# Patient Record
Sex: Female | Born: 1970 | Race: White | Hispanic: No | Marital: Married
Health system: Southern US, Community
[De-identification: ages and names within clinical notes are randomized; demographics above are authoritative.]

---

## 2015-08-01 ENCOUNTER — Other Ambulatory Visit: Payer: Self-pay

## 2015-08-01 DIAGNOSIS — Z1231 Encounter for screening mammogram for malignant neoplasm of breast: Secondary | ICD-10-CM

## 2015-09-05 ENCOUNTER — Other Ambulatory Visit: Payer: Self-pay

## 2015-09-05 ENCOUNTER — Ambulatory Visit
Admission: RE | Admit: 2015-09-05 | Discharge: 2015-09-05 | Disposition: A | Discharge status: Home / Self Care | Payer: BLUE CROSS/BLUE SHIELD | Source: Ambulatory Visit

## 2015-09-05 DIAGNOSIS — R928 Other abnormal and inconclusive findings on diagnostic imaging of breast: Secondary | ICD-10-CM

## 2015-09-05 DIAGNOSIS — Z1231 Encounter for screening mammogram for malignant neoplasm of breast: Secondary | ICD-10-CM

## 2016-08-05 ENCOUNTER — Other Ambulatory Visit: Payer: Self-pay | Admitting: Family Medicine

## 2016-08-05 ENCOUNTER — Other Ambulatory Visit (HOSPITAL_COMMUNITY)
Admission: RE | Admit: 2016-08-05 | Discharge: 2016-08-05 | Disposition: A | Discharge status: Home / Self Care | Payer: BLUE CROSS/BLUE SHIELD | Source: Ambulatory Visit | Attending: Family Medicine | Admitting: Family Medicine

## 2016-08-05 DIAGNOSIS — Z1151 Encounter for screening for human papillomavirus (HPV): Secondary | ICD-10-CM | POA: Diagnosis not present

## 2016-08-05 DIAGNOSIS — Z124 Encounter for screening for malignant neoplasm of cervix: Secondary | ICD-10-CM | POA: Diagnosis present

## 2016-08-06 LAB — CYTOLOGY - PAP
DIAGNOSIS: NEGATIVE
HPV (WINDOPATH): NOT DETECTED

## 2016-09-04 ENCOUNTER — Other Ambulatory Visit: Payer: Self-pay | Admitting: Family Medicine

## 2016-09-04 DIAGNOSIS — Z1231 Encounter for screening mammogram for malignant neoplasm of breast: Secondary | ICD-10-CM

## 2016-10-01 ENCOUNTER — Inpatient Hospital Stay: Admission: RE | Admit: 2016-10-01 | Payer: BLUE CROSS/BLUE SHIELD | Source: Ambulatory Visit

## 2016-10-22 ENCOUNTER — Ambulatory Visit
Admission: RE | Admit: 2016-10-22 | Discharge: 2016-10-22 | Disposition: A | Discharge status: Home / Self Care | Payer: BLUE CROSS/BLUE SHIELD | Source: Ambulatory Visit | Attending: Family Medicine | Admitting: Family Medicine

## 2016-10-22 DIAGNOSIS — Z1231 Encounter for screening mammogram for malignant neoplasm of breast: Secondary | ICD-10-CM

## 2017-09-21 ENCOUNTER — Other Ambulatory Visit: Payer: Self-pay | Admitting: Family Medicine

## 2017-09-21 DIAGNOSIS — Z1231 Encounter for screening mammogram for malignant neoplasm of breast: Secondary | ICD-10-CM

## 2017-10-26 ENCOUNTER — Ambulatory Visit
Admission: RE | Admit: 2017-10-26 | Discharge: 2017-10-26 | Disposition: A | Discharge status: Home / Self Care | Payer: BLUE CROSS/BLUE SHIELD | Source: Ambulatory Visit | Attending: Family Medicine | Admitting: Family Medicine

## 2017-10-26 DIAGNOSIS — Z1231 Encounter for screening mammogram for malignant neoplasm of breast: Secondary | ICD-10-CM

## 2018-10-04 ENCOUNTER — Other Ambulatory Visit: Payer: Self-pay | Admitting: Family Medicine

## 2018-10-04 DIAGNOSIS — Z1231 Encounter for screening mammogram for malignant neoplasm of breast: Secondary | ICD-10-CM

## 2018-11-03 ENCOUNTER — Ambulatory Visit
Admission: RE | Admit: 2018-11-03 | Discharge: 2018-11-03 | Disposition: A | Discharge status: Home / Self Care | Payer: BLUE CROSS/BLUE SHIELD | Source: Ambulatory Visit | Attending: Family Medicine | Admitting: Family Medicine

## 2018-11-03 DIAGNOSIS — Z1231 Encounter for screening mammogram for malignant neoplasm of breast: Secondary | ICD-10-CM

## 2019-10-04 ENCOUNTER — Other Ambulatory Visit: Payer: Self-pay | Admitting: Family Medicine

## 2019-10-04 DIAGNOSIS — Z1231 Encounter for screening mammogram for malignant neoplasm of breast: Secondary | ICD-10-CM

## 2019-11-10 ENCOUNTER — Other Ambulatory Visit: Payer: Self-pay

## 2019-11-10 ENCOUNTER — Ambulatory Visit
Admission: RE | Admit: 2019-11-10 | Discharge: 2019-11-10 | Disposition: A | Discharge status: Home / Self Care | Payer: BC Managed Care – PPO | Source: Ambulatory Visit | Attending: Family Medicine | Admitting: Family Medicine

## 2019-11-10 DIAGNOSIS — Z1231 Encounter for screening mammogram for malignant neoplasm of breast: Secondary | ICD-10-CM

## 2019-12-02 ENCOUNTER — Ambulatory Visit: Payer: BC Managed Care – PPO | Attending: Internal Medicine

## 2019-12-02 DIAGNOSIS — Z23 Encounter for immunization: Secondary | ICD-10-CM

## 2019-12-02 NOTE — Progress Notes (Signed)
   Covid-19 Vaccination Clinic  Name:  Christine Robinson    MRN: [id number] DOB: 09-Jan-1971  12/02/2019  [name] was observed post Covid-19 immunization for 15 minutes without incident. She was provided with Vaccine Information Sheet and instruction to access the V-Safe system.   [name] was instructed to call 911 with any severe reactions post vaccine: Marland Kitchen Difficulty breathing  . Swelling of face and throat  . A fast heartbeat  . A bad rash all over body  . Dizziness and weakness   Immunizations Administered    Name Date Dose VIS Date Route   Pfizer COVID-19 Vaccine 12/02/2019  4:03 PM 0.3 mL 08/11/2019 Intramuscular   Manufacturer: [employer], [employer]   Lot: [id number]   NDC: [address]-6

## 2019-12-27 ENCOUNTER — Ambulatory Visit: Payer: BC Managed Care – PPO | Attending: Internal Medicine

## 2019-12-27 DIAGNOSIS — Z23 Encounter for immunization: Secondary | ICD-10-CM

## 2019-12-27 NOTE — Progress Notes (Signed)
   Covid-19 Vaccination Clinic  Name:  Christine Robinson    MRN: [id number] DOB: 11/21/1970  12/27/2019  Christine Robinson was observed post Covid-19 immunization for 15 minutes without incident. She was provided with Vaccine Information Sheet and instruction to access the V-Safe system.   Christine Robinson was instructed to call 911 with any severe reactions post vaccine: Marland Kitchen Difficulty breathing  . Swelling of face and throat  . A fast heartbeat  . A bad rash all over body  . Dizziness and weakness   Immunizations Administered    Name Date Dose VIS Date Route   Pfizer COVID-19 Vaccine 12/27/2019  1:06 PM 0.3 mL 10/25/2018 Intramuscular   Manufacturer: [employer], [employer]   Lot: [id number]   NDC: [address]-2

## 2020-08-02 ENCOUNTER — Ambulatory Visit: Payer: BC Managed Care – PPO

## 2020-10-22 ENCOUNTER — Other Ambulatory Visit: Payer: Self-pay | Admitting: Family Medicine

## 2020-10-22 DIAGNOSIS — R1013 Epigastric pain: Secondary | ICD-10-CM

## 2020-10-22 DIAGNOSIS — Z1231 Encounter for screening mammogram for malignant neoplasm of breast: Secondary | ICD-10-CM

## 2020-10-24 ENCOUNTER — Ambulatory Visit
Admission: RE | Admit: 2020-10-24 | Discharge: 2020-10-24 | Disposition: A | Discharge status: Home / Self Care | Payer: BC Managed Care – PPO | Source: Ambulatory Visit | Attending: Family Medicine | Admitting: Family Medicine

## 2020-10-24 DIAGNOSIS — R1013 Epigastric pain: Secondary | ICD-10-CM

## 2020-12-11 ENCOUNTER — Ambulatory Visit
Admission: RE | Admit: 2020-12-11 | Discharge: 2020-12-11 | Disposition: A | Discharge status: Home / Self Care | Payer: BC Managed Care – PPO | Source: Ambulatory Visit | Attending: Family Medicine | Admitting: Family Medicine

## 2020-12-11 ENCOUNTER — Other Ambulatory Visit: Payer: Self-pay

## 2020-12-11 DIAGNOSIS — Z1231 Encounter for screening mammogram for malignant neoplasm of breast: Secondary | ICD-10-CM

## 2020-12-13 ENCOUNTER — Other Ambulatory Visit: Payer: Self-pay | Admitting: Family Medicine

## 2020-12-13 DIAGNOSIS — R928 Other abnormal and inconclusive findings on diagnostic imaging of breast: Secondary | ICD-10-CM

## 2021-01-02 ENCOUNTER — Ambulatory Visit: Payer: BC Managed Care – PPO

## 2021-01-02 ENCOUNTER — Ambulatory Visit
Admission: RE | Admit: 2021-01-02 | Discharge: 2021-01-02 | Disposition: A | Discharge status: Home / Self Care | Payer: BC Managed Care – PPO | Source: Ambulatory Visit | Attending: Family Medicine | Admitting: Family Medicine

## 2021-01-02 ENCOUNTER — Other Ambulatory Visit: Payer: Self-pay

## 2021-01-02 DIAGNOSIS — R928 Other abnormal and inconclusive findings on diagnostic imaging of breast: Secondary | ICD-10-CM

## 2021-12-22 ENCOUNTER — Other Ambulatory Visit: Payer: Self-pay | Admitting: Family Medicine

## 2021-12-22 DIAGNOSIS — Z1231 Encounter for screening mammogram for malignant neoplasm of breast: Secondary | ICD-10-CM

## 2021-12-29 ENCOUNTER — Ambulatory Visit
Admission: RE | Admit: 2021-12-29 | Discharge: 2021-12-29 | Disposition: A | Discharge status: Home / Self Care | Payer: BC Managed Care – PPO | Source: Ambulatory Visit | Attending: Family Medicine | Admitting: Family Medicine

## 2021-12-29 DIAGNOSIS — Z1231 Encounter for screening mammogram for malignant neoplasm of breast: Secondary | ICD-10-CM

## 2022-03-14 IMAGING — MG MM DIGITAL SCREENING BILAT W/ TOMO AND CAD
8 series · 8 of 24 positions shown · non-contrast
Comparison: Previous exam(s).

CLINICAL DATA: Screening.

EXAM:
DIGITAL SCREENING BILATERAL MAMMOGRAM WITH TOMOSYNTHESIS AND CAD
TECHNIQUE: Bilateral screening digital craniocaudal and mediolateral oblique
mammograms were obtained. Bilateral screening digital breast
tomosynthesis was performed. The images were evaluated with
computer-aided detection.

[L MLO synth-2D]
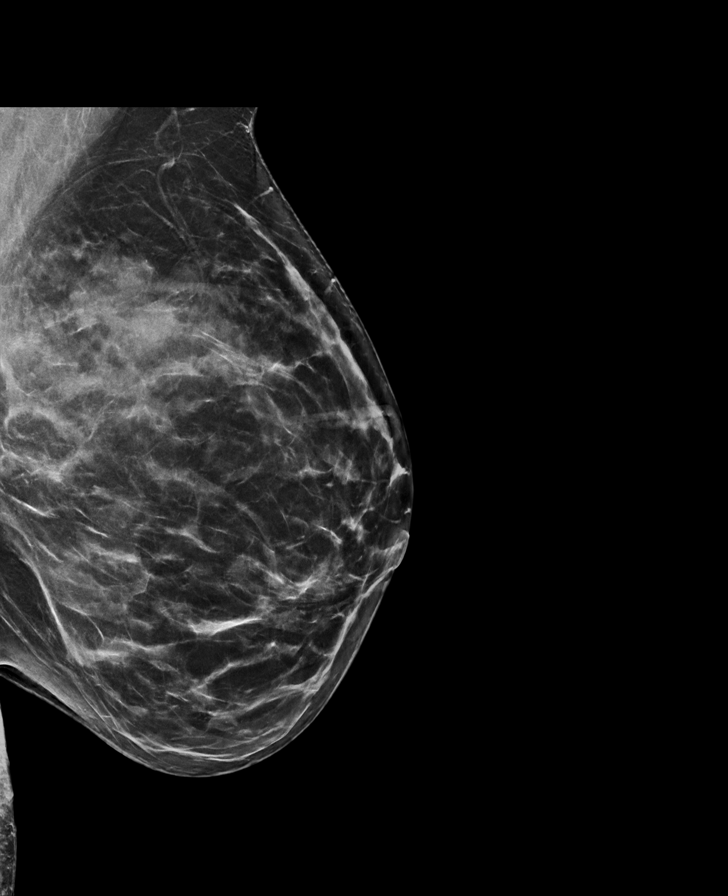

[L CC synth-2D]
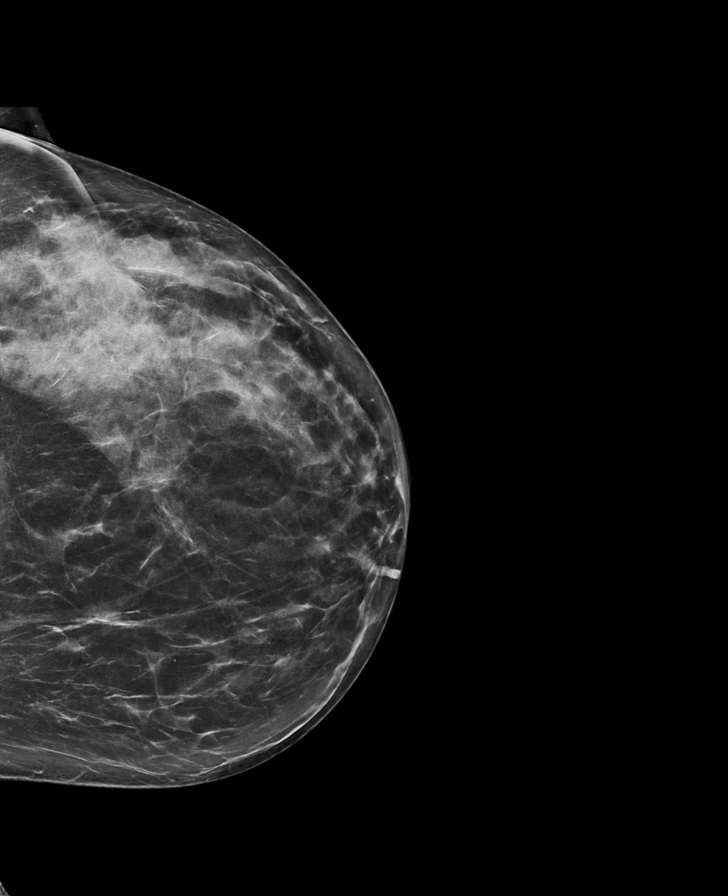

[R CC synth-2D]
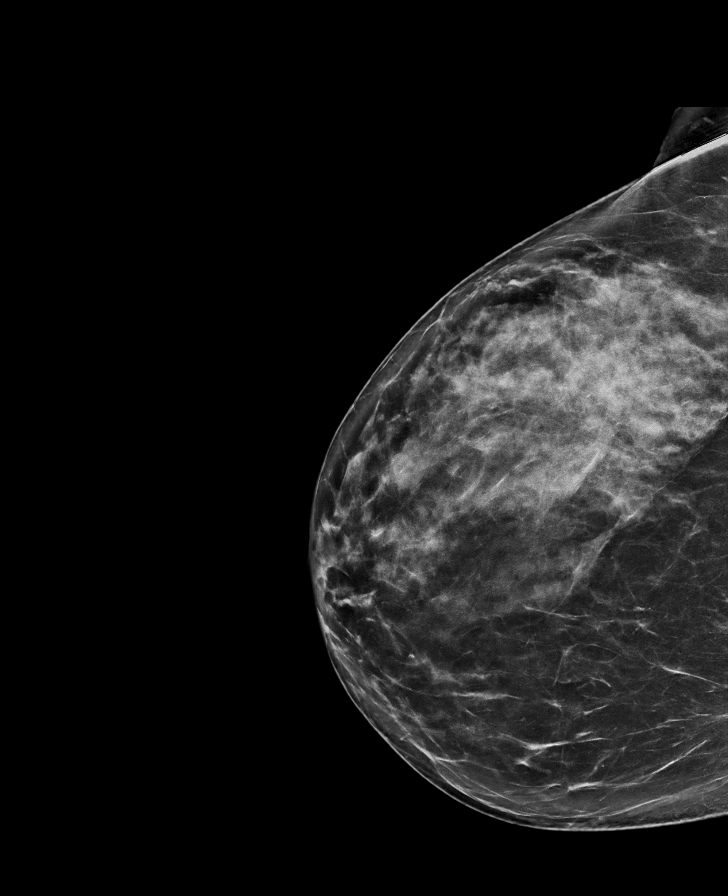

[R MLO synth-2D]
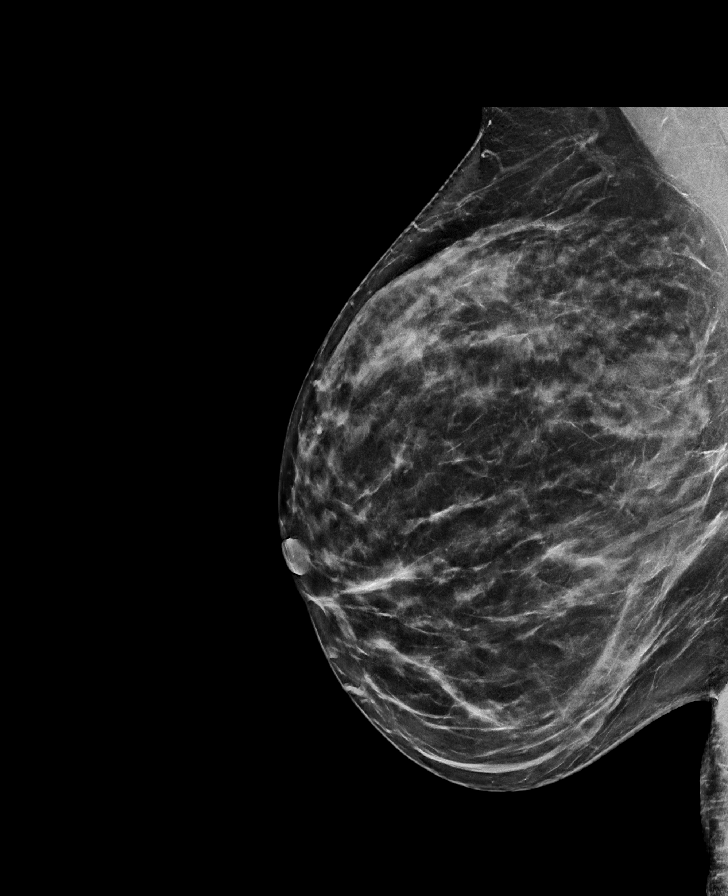

[L MLO tomo · tomo slice 42/83.0]
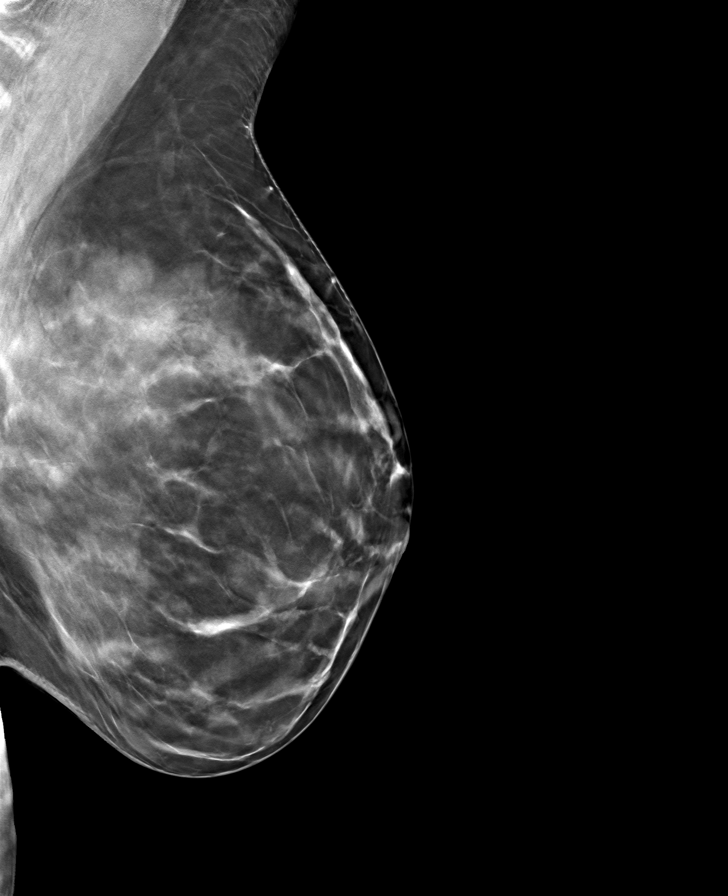

[L CC tomo · tomo slice 43/84.0]
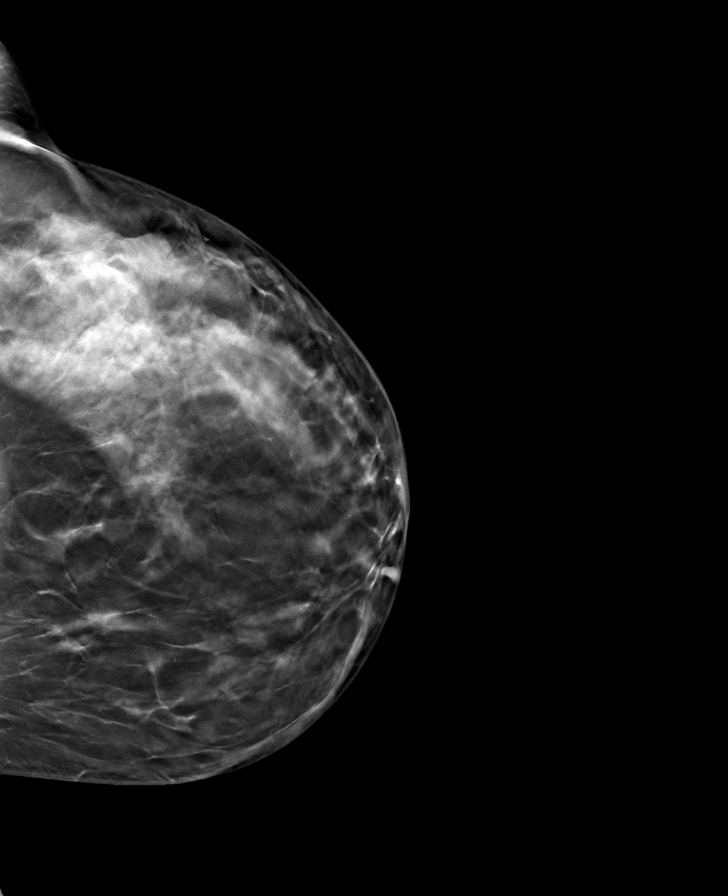

[R CC tomo · tomo slice 43/85.0]
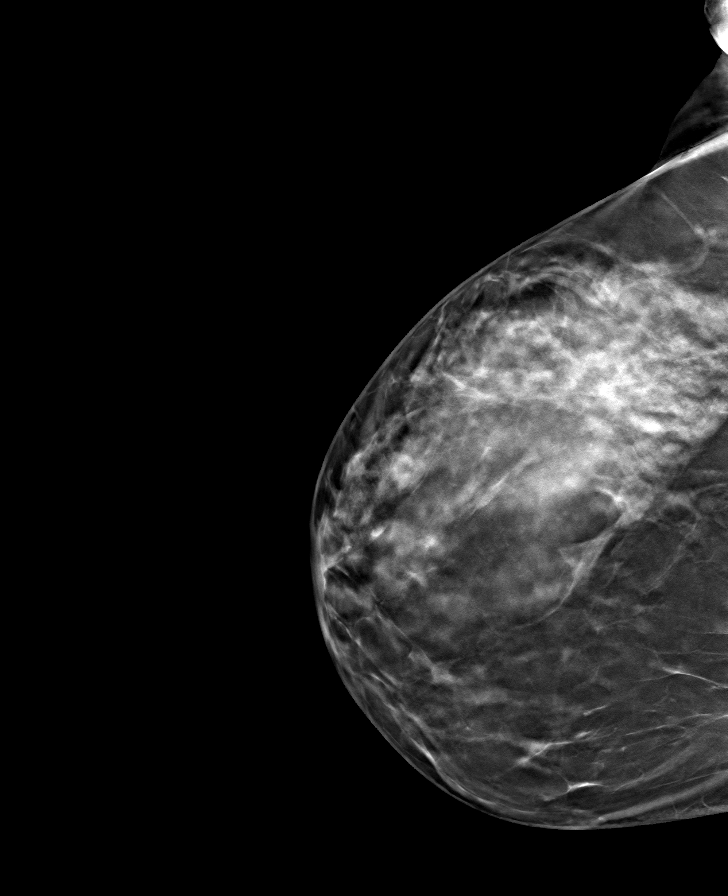

[R MLO tomo · tomo slice 43/84.0]
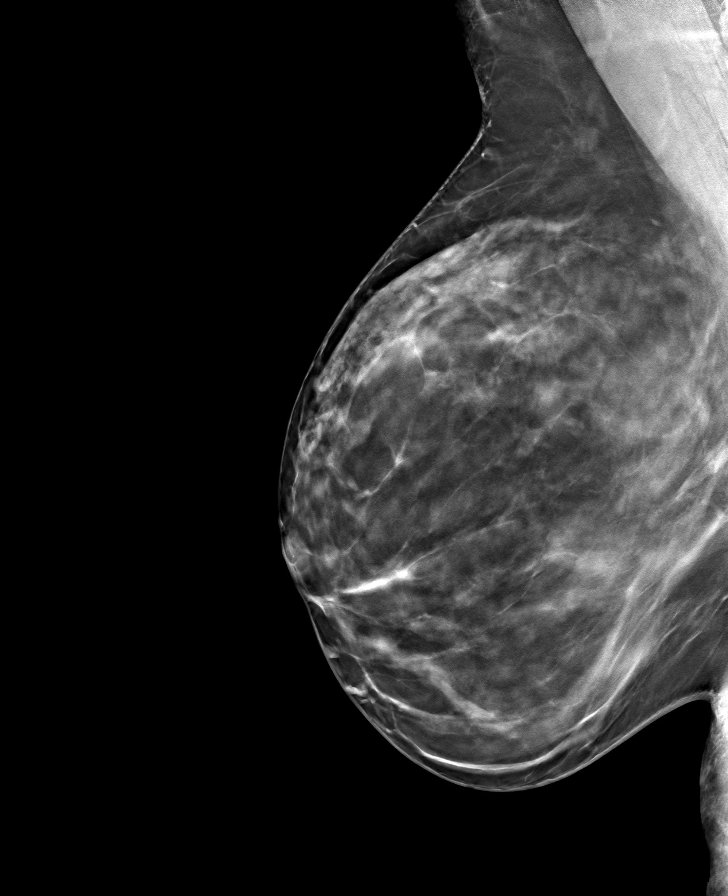

[8 of 24 positions shown; findings below may reference images not displayed]

ACR Breast Density Category c: The breast tissue is heterogeneously
dense, which may obscure small masses.
FINDINGS: In the left breast, a possible asymmetry in the posterior, superior
aspect of the breast in the oblique projection warrants further
evaluation. In the right breast, no findings suspicious for
malignancy.
IMPRESSION: Further evaluation is suggested for a possible asymmetry in the left
breast.

RECOMMENDATION:
Diagnostic mammogram and possibly ultrasound of the left breast.
(Code:[id number])

The patient will be contacted regarding the findings, and additional
imaging will be scheduled.

BI-RADS CATEGORY  0: Incomplete. Need additional imaging evaluation
and/or prior mammograms for comparison.

## 2022-10-29 ENCOUNTER — Other Ambulatory Visit: Payer: Self-pay | Admitting: Family Medicine

## 2022-10-29 ENCOUNTER — Ambulatory Visit
Admission: RE | Admit: 2022-10-29 | Discharge: 2022-10-29 | Disposition: A | Discharge status: Home / Self Care | Payer: No Typology Code available for payment source | Source: Ambulatory Visit | Attending: Family Medicine | Admitting: Family Medicine

## 2022-10-29 DIAGNOSIS — M533 Sacrococcygeal disorders, not elsewhere classified: Secondary | ICD-10-CM

## 2022-12-28 ENCOUNTER — Other Ambulatory Visit: Payer: Self-pay | Admitting: Family Medicine

## 2022-12-28 DIAGNOSIS — Z1231 Encounter for screening mammogram for malignant neoplasm of breast: Secondary | ICD-10-CM

## 2023-01-01 ENCOUNTER — Ambulatory Visit
Admission: RE | Admit: 2023-01-01 | Discharge: 2023-01-01 | Disposition: A | Discharge status: Home / Self Care | Payer: No Typology Code available for payment source | Source: Ambulatory Visit | Attending: Family Medicine | Admitting: Family Medicine

## 2023-01-01 DIAGNOSIS — Z1231 Encounter for screening mammogram for malignant neoplasm of breast: Secondary | ICD-10-CM

## 2023-06-25 ENCOUNTER — Other Ambulatory Visit (HOSPITAL_BASED_OUTPATIENT_CLINIC_OR_DEPARTMENT_OTHER): Payer: Self-pay

## 2023-06-25 MED ORDER — COVID-19 MRNA VAC-TRIS(PFIZER) 30 MCG/0.3ML IM SUSY
0.3000 mL | PREFILLED_SYRINGE | Freq: Once | INTRAMUSCULAR | 0 refills | Status: AC
  Filled 2023-06-25: qty 0.3, 1d supply, fill #0

## 2024-02-14 ENCOUNTER — Ambulatory Visit
Admission: RE | Admit: 2024-02-14 | Discharge: 2024-02-14 | Disposition: A | Discharge status: Home / Self Care | Source: Ambulatory Visit | Attending: Family Medicine | Admitting: Family Medicine

## 2024-02-14 ENCOUNTER — Other Ambulatory Visit: Payer: Self-pay | Admitting: Family Medicine

## 2024-02-14 DIAGNOSIS — Z1231 Encounter for screening mammogram for malignant neoplasm of breast: Secondary | ICD-10-CM

## 2024-08-05 ENCOUNTER — Other Ambulatory Visit: Payer: Self-pay | Admitting: Medical Genetics
